# Patient Record
Sex: Male | Born: 1990 | Race: White | Hispanic: No | Smoking: Current every day smoker
Health system: Southern US, Community
[De-identification: ages and names within clinical notes are randomized; demographics above are authoritative.]

## PROBLEM LIST (undated history)

## (undated) HISTORY — PX: FOOT SURGERY: SHX648

---

## 2019-06-20 ENCOUNTER — Other Ambulatory Visit: Payer: Self-pay

## 2019-06-20 ENCOUNTER — Encounter (HOSPITAL_COMMUNITY): Payer: Self-pay

## 2019-06-20 ENCOUNTER — Emergency Department (HOSPITAL_COMMUNITY)
Admission: EM | Admit: 2019-06-20 | Discharge: 2019-06-20 | Disposition: A | Discharge status: Home / Self Care | Payer: Self-pay | Attending: Emergency Medicine | Admitting: Emergency Medicine

## 2019-06-20 DIAGNOSIS — G2581 Restless legs syndrome: Secondary | ICD-10-CM | POA: Insufficient documentation

## 2019-06-20 DIAGNOSIS — G4700 Insomnia, unspecified: Secondary | ICD-10-CM | POA: Insufficient documentation

## 2019-06-20 DIAGNOSIS — F17200 Nicotine dependence, unspecified, uncomplicated: Secondary | ICD-10-CM | POA: Insufficient documentation

## 2019-06-20 MED ORDER — CLONIDINE HCL 0.1 MG PO TABS
0.1000 mg | ORAL_TABLET | Freq: Once | ORAL | Status: AC
  Administered 2019-06-20: 0.1 mg via ORAL
  Filled 2019-06-20: qty 1

## 2019-06-20 MED ORDER — CLONIDINE HCL 0.1 MG PO TABS
0.1000 mg | ORAL_TABLET | Freq: Two times a day (BID) | ORAL | 0 refills | Status: AC
Start: 2019-06-20 — End: 2019-06-25

## 2019-06-20 MED ORDER — HYDROXYZINE HCL 25 MG PO TABS
25.0000 mg | ORAL_TABLET | Freq: Once | ORAL | Status: AC
  Administered 2019-06-20: 25 mg via ORAL
  Filled 2019-06-20: qty 1

## 2019-06-20 MED ORDER — HYDROXYZINE HCL 25 MG PO TABS: 25.0000 mg | ORAL_TABLET | Freq: Three times a day (TID) | ORAL | 0 refills | Status: AC | PRN

## 2019-06-20 NOTE — ED Provider Notes (Signed)
[facility]-EMERGENCY DEPT Provider Note   CSN: [id number] Arrival date & time: 06/20/19  1214     History Chief Complaint  Patient presents with  . Insomnia  . Leg Pain    Matthew Berg is a 29 y.o. male.  The history is provided by the patient.  Illness Location:  General Severity:  Mild Onset quality:  Gradual Timing:  Constant Progression:  Unchanged Chronicity:  New Context:  Patient here due to poor sleep, increased restless legs. 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 Has not been able to sleep. Relieved by:  Nothing Worsened by:  Nothing  Associated symptoms: no abdominal pain, no chest pain, no fever and no shortness of breath        History reviewed. No pertinent past medical history.  There are no problems to display for this patient.   Past Surgical History:  Procedure Laterality Date  . FOOT SURGERY         No family history on file.  Social History   Tobacco Use  . Smoking status: Current Every Day Smoker  . Smokeless tobacco: Never Used  Substance Use Topics  . Alcohol use: Never  . Drug use: Not Currently    Home Medications Prior to Admission medications   Medication Sig Start Date End Date Taking? Authorizing Provider  cloNIDine (CATAPRES) 0.1 MG tablet Take 1 tablet (0.1 mg total) by mouth 2 (two) times daily for 5 days. 06/20/19 06/25/19  Dwayne Begay, DO  hydrOXYzine (ATARAX/VISTARIL) 25 MG tablet Take 1 tablet (25 mg total) by mouth every 8 (eight) hours as needed for up to 20 doses for anxiety. 06/20/19   Virgina Norfolk, DO    Allergies    Patient has no known allergies.  Review of Systems   Review of Systems  Constitutional: Negative for fever.  Respiratory: Negative for shortness of breath.   Cardiovascular: Negative for chest pain.  Gastrointestinal: Negative for abdominal pain.  Neurological: Negative for dizziness, syncope, speech difficulty, weakness and numbness.    Psychiatric/Behavioral: Positive for sleep disturbance. Negative for agitation, behavioral problems, confusion, decreased concentration, dysphoric mood, hallucinations, self-injury and suicidal ideas. The patient is hyperactive.     Physical Exam Updated Vital Signs BP (!) 151/91 (BP Location: Left Arm)   Pulse 85   Temp 98.2 F (36.8 C) (Oral)   Resp 18   SpO2 99%   Physical Exam Vitals and nursing note reviewed.  Constitutional:      Appearance: He is well-developed.  HENT:     Head: Normocephalic and atraumatic.  Eyes:     Conjunctiva/sclera: Conjunctivae normal.  Cardiovascular:     Rate and Rhythm: Normal rate and regular rhythm.     Pulses: Normal pulses.     Heart sounds: No murmur.  Pulmonary:     Effort: Pulmonary effort is normal. No respiratory distress.     Breath sounds: Normal breath sounds.  Abdominal:     Palpations: Abdomen is soft.     Tenderness: There is no abdominal tenderness.  Musculoskeletal:     Cervical back: Neck supple.  Skin:    General: Skin is warm and dry.  Neurological:     Mental Status: He is alert.  Psychiatric:        Mood and Affect: Mood normal.        Thought Content: Thought content normal.        Judgment: Judgment normal.     ED Results / Procedures / Treatments  Labs (all labs ordered are listed, but only abnormal results are displayed) Labs Reviewed - No data to display  EKG None  Radiology No results found.  Procedures Procedures (including critical care time)  Medications Ordered in ED Medications  cloNIDine (CATAPRES) tablet 0.1 mg (has no administration in time range)  hydrOXYzine (ATARAX/VISTARIL) tablet 25 mg (has no administration in time range)    ED Course  I have reviewed the triage vital signs and the nursing notes.  Pertinent labs & imaging results that were available during my care of the patient were reviewed by me and considered in my medical decision making (see chart for details).     MDM Rules/Calculators/A&P                      Matthew Berg is a 29 year old male who presents to the ED with restless legs, poor sleep.  Patient with unremarkable vitals.  Just finished detox from heroin last week and now currently in rehab.  Having difficulty with sleep, restless leg symptoms.  Patient denies any nausea, vomiting, diarrhea, myalgias.  Overall appears to be a little bit anxious.  Will attempt clonidine and Atarax to help with his symptoms.  Otherwise no acute emergency at this time.  Discharged back to rehab facility.  This chart was dictated using voice recognition software.  Despite best efforts to proofread,  errors can occur which can change the documentation meaning.   Final Clinical Impression(s) / ED Diagnoses Final diagnoses:  Insomnia, unspecified type    Rx / DC Orders ED Discharge Orders         Ordered    cloNIDine (CATAPRES) 0.1 MG tablet  2 times daily     06/20/19 1335    hydrOXYzine (ATARAX/VISTARIL) 25 MG tablet  Every 8 hours PRN     06/20/19 1335           Lennice Sites, DO 06/20/19 1336

## 2019-06-20 NOTE — ED Triage Notes (Signed)
Pt presents with c/o bilateral leg pain r/t his restless leg syndrome. 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

## 2019-06-20 NOTE — ED Notes (Addendum)
Pt verbalizes understanding of DC instructions. Pt belongings returned and is ambulatory out of ED.    Signature pad not availible 

## 2019-07-01 ENCOUNTER — Emergency Department (HOSPITAL_COMMUNITY): Payer: Self-pay

## 2019-07-01 ENCOUNTER — Other Ambulatory Visit: Payer: Self-pay

## 2019-07-01 ENCOUNTER — Emergency Department (HOSPITAL_COMMUNITY)
Admission: EM | Admit: 2019-07-01 | Discharge: 2019-07-01 | Disposition: A | Discharge status: Home / Self Care | Payer: Self-pay | Attending: Emergency Medicine | Admitting: Emergency Medicine

## 2019-07-01 ENCOUNTER — Encounter (HOSPITAL_COMMUNITY): Payer: Self-pay | Admitting: Emergency Medicine

## 2019-07-01 DIAGNOSIS — Z79899 Other long term (current) drug therapy: Secondary | ICD-10-CM | POA: Insufficient documentation

## 2019-07-01 DIAGNOSIS — R509 Fever, unspecified: Secondary | ICD-10-CM

## 2019-07-01 DIAGNOSIS — Z20822 Contact with and (suspected) exposure to covid-19: Secondary | ICD-10-CM | POA: Insufficient documentation

## 2019-07-01 DIAGNOSIS — F17200 Nicotine dependence, unspecified, uncomplicated: Secondary | ICD-10-CM | POA: Insufficient documentation

## 2019-07-01 DIAGNOSIS — R251 Tremor, unspecified: Secondary | ICD-10-CM | POA: Insufficient documentation

## 2019-07-01 LAB — CBC WITH DIFFERENTIAL/PLATELET
Abs Immature Granulocytes: 0.04 10*3/uL (ref 0.00–0.07)
Basophils Absolute: 0 10*3/uL (ref 0.0–0.1)
Basophils Relative: 1 %
Eosinophils Absolute: 0.1 10*3/uL (ref 0.0–0.5)
Eosinophils Relative: 1 %
HCT: 40.4 % (ref 39.0–52.0)
Hemoglobin: 13.4 g/dL (ref 13.0–17.0)
Immature Granulocytes: 1 %
Lymphocytes Relative: 20 %
Lymphs Abs: 1.3 10*3/uL (ref 0.7–4.0)
MCH: 28.6 pg (ref 26.0–34.0)
MCHC: 33.2 g/dL (ref 30.0–36.0)
MCV: 86.3 fL (ref 80.0–100.0)
Monocytes Absolute: 0.3 10*3/uL (ref 0.1–1.0)
Monocytes Relative: 5 %
Neutro Abs: 4.9 10*3/uL (ref 1.7–7.7)
Neutrophils Relative %: 72 %
Platelets: 235 10*3/uL (ref 150–400)
RBC: 4.68 MIL/uL (ref 4.22–5.81)
RDW: 13.1 % (ref 11.5–15.5)
WBC: 6.7 10*3/uL (ref 4.0–10.5)
nRBC: 0 % (ref 0.0–0.2)

## 2019-07-01 LAB — COMPREHENSIVE METABOLIC PANEL
ALT: 69 U/L — ABNORMAL HIGH (ref 0–44)
AST: 41 U/L (ref 15–41)
Albumin: 4.2 g/dL (ref 3.5–5.0)
Alkaline Phosphatase: 71 U/L (ref 38–126)
Anion gap: 12 (ref 5–15)
BUN: 14 mg/dL (ref 6–20)
CO2: 21 mmol/L — ABNORMAL LOW (ref 22–32)
Calcium: 8.9 mg/dL (ref 8.9–10.3)
Chloride: 104 mmol/L (ref 98–111)
Creatinine, Ser: 0.8 mg/dL (ref 0.61–1.24)
GFR calc Af Amer: >60 mL/min (ref >60)
GFR calc non Af Amer: >60 mL/min (ref >60)
Glucose, Bld: 97 mg/dL (ref 70–99)
Potassium: 4.1 mmol/L (ref 3.5–5.1)
Sodium: 137 mmol/L (ref 135–145)
Total Bilirubin: 0.4 mg/dL (ref 0.3–1.2)
Total Protein: 7.6 g/dL (ref 6.5–8.1)

## 2019-07-01 LAB — RAPID URINE DRUG SCREEN, HOSP PERFORMED
Amphetamines: NOT DETECTED
Barbiturates: NOT DETECTED
Benzodiazepines: NOT DETECTED
Cocaine: NOT DETECTED
Opiates: NOT DETECTED
Tetrahydrocannabinol: NOT DETECTED

## 2019-07-01 LAB — URINALYSIS, ROUTINE W REFLEX MICROSCOPIC
Bilirubin Urine: NEGATIVE
Glucose, UA: NEGATIVE mg/dL
Hgb urine dipstick: NEGATIVE
Ketones, ur: NEGATIVE mg/dL
Leukocytes,Ua: NEGATIVE
Nitrite: NEGATIVE
Protein, ur: NEGATIVE mg/dL
Specific Gravity, Urine: 1.015 (ref 1.005–1.030)
pH: 5 (ref 5.0–8.0)

## 2019-07-01 LAB — ETHANOL: Alcohol, Ethyl (B): <10 mg/dL (ref <10)

## 2019-07-01 LAB — LACTIC ACID, PLASMA: Lactic Acid, Venous: 0.9 mmol/L (ref 0.5–1.9)

## 2019-07-01 LAB — SARS CORONAVIRUS 2 BY RT PCR (HOSPITAL ORDER, PERFORMED IN ~~LOC~~ HOSPITAL LAB): SARS Coronavirus 2: NEGATIVE

## 2019-07-01 MED ORDER — SODIUM CHLORIDE 0.9 % IV BOLUS
1000.0000 mL | Freq: Once | INTRAVENOUS | Status: AC
  Administered 2019-07-01: 1000 mL via INTRAVENOUS

## 2019-07-01 MED ORDER — ACETAMINOPHEN 325 MG PO TABS
650.0000 mg | ORAL_TABLET | Freq: Once | ORAL | Status: AC
  Administered 2019-07-01: 650 mg via ORAL
  Filled 2019-07-01: qty 2

## 2019-07-01 NOTE — ED Provider Notes (Signed)
[facility]-EMERGENCY DEPT Provider Note   CSN: [id number] Arrival date & time: 07/01/19  1738     History Chief Complaint  Patient presents with  . Shaking    Matthew Berg is a 29 y.o. male.  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 He has not had any cough, congestion, N/V/D or dysuria. HE is unable to give any additional details about the duration, timing or quality of his symptoms. Patient reports he was outside during the day today, but was under a shade tree.     History reviewed. No pertinent past medical history.  There are no problems to display for this patient.   Past Surgical History:  Procedure Laterality Date  . FOOT SURGERY         No family history on file.  Social History   Tobacco Use  . Smoking status: Current Every Day Smoker  . Smokeless tobacco: Never Used  Substance Use Topics  . Alcohol use: Never  . Drug use: Not Currently    Home Medications Prior to Admission medications   Medication Sig Start Date End Date Taking? Authorizing Provider  Aspirin-Salicylamide-Caffeine (BC HEADACHE PO) Take 1 packet by mouth daily as needed (headache).   Yes [provider]  hydrOXYzine (ATARAX/VISTARIL) 25 MG tablet Take 1 tablet (25 mg total) by mouth every 8 (eight) hours as needed for up to 20 doses for anxiety. 06/20/19  Yes Curatolo, Adam, DO  cloNIDine (CATAPRES) 0.1 MG tablet Take 1 tablet (0.1 mg total) by mouth 2 (two) times daily for 5 days. 06/20/19 06/25/19  Virgina Norfolk, DO    Allergies    Patient has no known allergies.  Review of Systems   Review of Systems A comprehensive review of systems was completed and negative except as noted in HPI.   Physical Exam Updated Vital Signs BP (!) 142/79   Pulse 81    Temp 100 F (37.8 C) (Oral)   Resp (!) 24   SpO2 100%   Physical Exam Vitals and nursing note reviewed.  Constitutional:      Appearance: Normal appearance.  HENT:     Head: Normocephalic and atraumatic.     Nose: Nose normal.     Mouth/Throat:     Mouth: Mucous membranes are moist.  Eyes:     Extraocular Movements: Extraocular movements intact.     Conjunctiva/sclera: Conjunctivae normal.  Cardiovascular:     Rate and Rhythm: Normal rate.  Pulmonary:     Effort: Pulmonary effort is normal.     Breath sounds: Normal breath sounds.  Abdominal:     General: Abdomen is flat.     Palpations: Abdomen is soft.     Tenderness: There is no abdominal tenderness.  Musculoskeletal:        General: No swelling. Normal range of motion.     Cervical back: Normal range of motion and neck supple. No rigidity.  Skin:    General: Skin is warm and dry.  Neurological:     General: No focal deficit present.     Comments: Somnolent but arousable; slow to answer questions  Psychiatric:        Mood and Affect: Mood normal.     ED Results / Procedures / Treatments   Labs (all labs  ordered are listed, but only abnormal results are displayed) Labs Reviewed  COMPREHENSIVE METABOLIC PANEL - Abnormal; Notable for the following components:      Result Value   CO2 21 (*)    ALT 69 (*)    All other components within normal limits  SARS CORONAVIRUS 2 BY RT PCR (HOSPITAL ORDER, [facility])  CULTURE, BLOOD (ROUTINE X 2)  CULTURE, BLOOD (ROUTINE X 2)  ETHANOL  CBC WITH DIFFERENTIAL/PLATELET  URINALYSIS, ROUTINE W REFLEX MICROSCOPIC  RAPID URINE DRUG SCREEN, HOSP PERFORMED  LACTIC ACID, PLASMA  LACTIC ACID, PLASMA    EKG None  Radiology DG Chest 1 View  Result Date: 07/01/2019 CLINICAL DATA:  Fever, shakes and body aches EXAM: CHEST  1 VIEW COMPARISON:  None. FINDINGS: No consolidation, features of edema, pneumothorax, or effusion. Pulmonary vascularity is  normally distributed. The cardiomediastinal contours are unremarkable. No acute osseous or soft tissue abnormality. Telemetry leads overlie the chest. IMPRESSION: No acute cardiopulmonary abnormality. Electronically Signed   By: Lovena Le M.D.   On: 07/01/2019 19:09    Procedures Procedures (including critical care time)  Medications Ordered in ED Medications  sodium chloride 0.9 % bolus 1,000 mL (1,000 mLs Intravenous New Bag/Given 07/01/19 1845)  acetaminophen (TYLENOL) tablet 650 mg (650 mg Oral Given 07/01/19 1834)    ED Course  I have reviewed the triage vital signs and the nursing notes.  Pertinent labs & imaging results that were available during my care of the patient were reviewed by me and considered in my medical decision making (see chart for details).  Clinical Course as of Jun 30 2236  Nancy Fetter Jul 01, 2019  1914 CXR is negative.    [CS]  1920 WBC is normal   [CS]  2235 Patient feeling better after IVF. No additional fevers or shaking spells in the ED. He has been ambulatory and would like to go home. Advised to drink plenty of fluid, stay out of the heat and return to the ED if symptoms return.    [CS]    Clinical Course User Index [CS] Truddie Hidden, MD   MDM Rules/Calculators/A&P                      Patient with low grade fever, body aches and chills. Has history of IVDA although reports no use in the last 3 weeks. No skin rashes to suggest endocarditis but he is high risk. Will check labs, CXR and UA to look for source of fever. Check Covid. Give IVF.  Final Clinical Impression(s) / ED Diagnoses Final diagnoses:  Fever, unknown origin    Rx / DC Orders ED Discharge Orders    None       Truddie Hidden, MD 07/01/19 2238

## 2019-07-01 NOTE — ED Triage Notes (Signed)
Pt reports having shakes that started this morning and having body aches.

## 2019-07-02 ENCOUNTER — Emergency Department (HOSPITAL_COMMUNITY): Payer: Self-pay

## 2019-07-02 ENCOUNTER — Encounter (HOSPITAL_COMMUNITY): Payer: Self-pay | Admitting: Emergency Medicine

## 2019-07-02 ENCOUNTER — Other Ambulatory Visit: Payer: Self-pay

## 2019-07-02 ENCOUNTER — Emergency Department (HOSPITAL_COMMUNITY)
Admission: EM | Admit: 2019-07-02 | Discharge: 2019-07-02 | Disposition: A | Discharge status: Home / Self Care | Payer: Self-pay | Attending: Emergency Medicine | Admitting: Emergency Medicine

## 2019-07-02 DIAGNOSIS — M791 Myalgia, unspecified site: Secondary | ICD-10-CM

## 2019-07-02 DIAGNOSIS — M7918 Myalgia, other site: Secondary | ICD-10-CM | POA: Insufficient documentation

## 2019-07-02 DIAGNOSIS — R509 Fever, unspecified: Secondary | ICD-10-CM | POA: Insufficient documentation

## 2019-07-02 DIAGNOSIS — R55 Syncope and collapse: Secondary | ICD-10-CM | POA: Insufficient documentation

## 2019-07-02 DIAGNOSIS — I10 Essential (primary) hypertension: Secondary | ICD-10-CM | POA: Insufficient documentation

## 2019-07-02 DIAGNOSIS — F17210 Nicotine dependence, cigarettes, uncomplicated: Secondary | ICD-10-CM | POA: Insufficient documentation

## 2019-07-02 LAB — CBC WITH DIFFERENTIAL/PLATELET
Abs Immature Granulocytes: 0.03 10*3/uL (ref 0.00–0.07)
Basophils Absolute: 0 10*3/uL (ref 0.0–0.1)
Basophils Relative: 0 %
Eosinophils Absolute: 0 10*3/uL (ref 0.0–0.5)
Eosinophils Relative: 1 %
HCT: 40.4 % (ref 39.0–52.0)
Hemoglobin: 13.1 g/dL (ref 13.0–17.0)
Immature Granulocytes: 1 %
Lymphocytes Relative: 17 %
Lymphs Abs: 0.9 10*3/uL (ref 0.7–4.0)
MCH: 28.6 pg (ref 26.0–34.0)
MCHC: 32.4 g/dL (ref 30.0–36.0)
MCV: 88.2 fL (ref 80.0–100.0)
Monocytes Absolute: 0.3 10*3/uL (ref 0.1–1.0)
Monocytes Relative: 5 %
Neutro Abs: 4.2 10*3/uL (ref 1.7–7.7)
Neutrophils Relative %: 76 %
Platelets: 193 10*3/uL (ref 150–400)
RBC: 4.58 MIL/uL (ref 4.22–5.81)
RDW: 13.2 % (ref 11.5–15.5)
WBC: 5.5 10*3/uL (ref 4.0–10.5)
nRBC: 0 % (ref 0.0–0.2)

## 2019-07-02 LAB — URINALYSIS, ROUTINE W REFLEX MICROSCOPIC
Bacteria, UA: NONE SEEN
Bilirubin Urine: NEGATIVE
Glucose, UA: NEGATIVE mg/dL
Hgb urine dipstick: NEGATIVE
Ketones, ur: NEGATIVE mg/dL
Leukocytes,Ua: NEGATIVE
Nitrite: NEGATIVE
Protein, ur: 30 mg/dL — AB
Specific Gravity, Urine: 1.024 (ref 1.005–1.030)
pH: 6 (ref 5.0–8.0)

## 2019-07-02 LAB — COMPREHENSIVE METABOLIC PANEL
ALT: 98 U/L — ABNORMAL HIGH (ref 0–44)
AST: 75 U/L — ABNORMAL HIGH (ref 15–41)
Albumin: 3.9 g/dL (ref 3.5–5.0)
Alkaline Phosphatase: 75 U/L (ref 38–126)
Anion gap: 7 (ref 5–15)
BUN: 15 mg/dL (ref 6–20)
CO2: 24 mmol/L (ref 22–32)
Calcium: 8.3 mg/dL — ABNORMAL LOW (ref 8.9–10.3)
Chloride: 105 mmol/L (ref 98–111)
Creatinine, Ser: 0.88 mg/dL (ref 0.61–1.24)
GFR calc Af Amer: >60 mL/min (ref >60)
GFR calc non Af Amer: >60 mL/min (ref >60)
Glucose, Bld: 116 mg/dL — ABNORMAL HIGH (ref 70–99)
Potassium: 4 mmol/L (ref 3.5–5.1)
Sodium: 136 mmol/L (ref 135–145)
Total Bilirubin: 0.3 mg/dL (ref 0.3–1.2)
Total Protein: 7.1 g/dL (ref 6.5–8.1)

## 2019-07-02 LAB — CK: Total CK: 76 U/L (ref 49–397)

## 2019-07-02 MED ORDER — DOXYCYCLINE HYCLATE 100 MG PO TABS: 100.0000 mg | ORAL_TABLET | Freq: Two times a day (BID) | ORAL | 0 refills | Status: AC

## 2019-07-02 MED ORDER — NAPROXEN 500 MG PO TABS: 500.0000 mg | ORAL_TABLET | Freq: Two times a day (BID) | ORAL | 0 refills | Status: AC

## 2019-07-02 MED ORDER — SODIUM CHLORIDE 0.9 % IV SOLN
1000.0000 mL | INTRAVENOUS | Status: DC
  Administered 2019-07-02: 1000 mL via INTRAVENOUS

## 2019-07-02 MED ORDER — SODIUM CHLORIDE 0.9 % IV BOLUS (SEPSIS)
1000.0000 mL | Freq: Once | INTRAVENOUS | Status: AC
  Administered 2019-07-02: 1000 mL via INTRAVENOUS

## 2019-07-02 MED ORDER — KETOROLAC TROMETHAMINE 30 MG/ML IJ SOLN
30.0000 mg | Freq: Once | INTRAMUSCULAR | Status: AC
  Administered 2019-07-02: 30 mg via INTRAVENOUS
  Filled 2019-07-02: qty 1

## 2019-07-02 NOTE — ED Triage Notes (Signed)
Patient reports body aches and dizziness since yesterday. States he had witnessed syncopal episode on way to hospital today. Denies hitting head. States he was seen here yesterday and dx with heat exhaustion.

## 2019-07-02 NOTE — Discharge Instructions (Addendum)
Take the medication as prescribed.   Follow up with a primary care doctor for further evaluation.

## 2019-07-02 NOTE — ED Provider Notes (Signed)
[facility]-EMERGENCY DEPT Provider Note   CSN: [id number] Arrival date & time: 07/02/19  1252     History Chief Complaint  Patient presents with  . Loss of Consciousness    Matthew Berg is a 29 y.o. male.  HPI    Patient presents the ED for evaluation of body aches and myalgias.  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  Patient states a few days ago he started having issues with generalized body aches.  He hurts all over.  He is having some shaking spells.  He has had some nausea.  Patient denies any cough or congestion.  No diarrhea.  Patient denies any tick bites or insect bites.  He denies having Covid exposure but has not been vaccinated.  Patient was seen in the emergency room yesterday for the same thing.  He was evaluated and released.  Patient states he continues to have the symptoms.  He states he had a fainting spell prior to arrival.  No past medical history on file.  There are no problems to display for this patient.   Past Surgical History:  Procedure Laterality Date  . FOOT SURGERY         No family history on file.  Social History   Tobacco Use  . Smoking status: Current Every Day Smoker    Packs/day: 1.00    Types: Cigarettes  . Smokeless tobacco: Never Used  Substance Use Topics  . Alcohol use: Never  . Drug use: Yes    Types: IV, Methamphetamines    Comment: States he uses meth and fentanyl     Home Medications Prior to Admission medications   Medication Sig Start Date End Date Taking? Authorizing Provider  Aspirin-Salicylamide-Caffeine (BC HEADACHE PO) Take 1 packet by mouth daily as needed (headache).    [provider]  cloNIDine (CATAPRES) 0.1 MG tablet Take 1 tablet (0.1 mg total) by mouth 2 (two) times daily for 5 days. 06/20/19 06/25/19  Curatolo, Adam, DO  doxycycline (VIBRA-TABS) 100 MG tablet Take 1 tablet (100 mg  total) by mouth 2 (two) times daily. 07/02/19   Linwood Dibbles, MD  hydrOXYzine (ATARAX/VISTARIL) 25 MG tablet Take 1 tablet (25 mg total) by mouth every 8 (eight) hours as needed for up to 20 doses for anxiety. 06/20/19   Curatolo, Adam, DO  naproxen (NAPROSYN) 500 MG tablet Take 1 tablet (500 mg total) by mouth 2 (two) times daily with a meal. As needed for pain 07/02/19   Linwood Dibbles, MD    Allergies    Patient has no known allergies.  Review of Systems   Review of Systems  All other systems reviewed and are negative.   Physical Exam Updated Vital Signs BP 138/67   Pulse 84   Temp 99.7 F (37.6 C) (Oral)   Resp 18   Ht 1.803 m (5\' 11" )   Wt 77.1 kg   SpO2 97%   BMI 23.71 kg/m   Physical Exam Vitals and nursing note reviewed.  Constitutional:      Appearance: He is well-developed. He is not toxic-appearing.  HENT:     Head: Normocephalic and atraumatic.     Right Ear: External ear normal.     Left Ear: External ear normal.     Nose: No congestion or rhinorrhea.     Mouth/Throat:     Pharynx: No oropharyngeal exudate or  posterior oropharyngeal erythema.  Eyes:     General: No scleral icterus.       Right eye: No discharge.        Left eye: No discharge.     Conjunctiva/sclera: Conjunctivae normal.  Neck:     Trachea: No tracheal deviation.  Cardiovascular:     Rate and Rhythm: Normal rate and regular rhythm.  Pulmonary:     Effort: Pulmonary effort is normal. No respiratory distress.     Breath sounds: Normal breath sounds. No stridor. No wheezing or rales.  Abdominal:     General: Bowel sounds are normal. There is no distension.     Palpations: Abdomen is soft.     Tenderness: There is no abdominal tenderness. There is no guarding or rebound.  Musculoskeletal:        General: Tenderness present.     Cervical back: Neck supple. No rigidity.     Comments: Mild tenderness to patient in the arms and legs, no areas of swelling, no evidence of joint effusion    Lymphadenopathy:     Cervical: No cervical adenopathy.  Skin:    General: Skin is warm and dry.     Findings: No rash.  Neurological:     Mental Status: He is alert.     Cranial Nerves: No cranial nerve deficit (no facial droop, extraocular movements intact, no slurred speech).     Sensory: No sensory deficit.     Motor: No abnormal muscle tone or seizure activity.     Coordination: Coordination normal.     ED Results / Procedures / Treatments   Labs (all labs ordered are listed, but only abnormal results are displayed) Labs Reviewed  COMPREHENSIVE METABOLIC PANEL - Abnormal; Notable for the following components:      Result Value   Glucose, Bld 116 (*)    Calcium 8.3 (*)    AST 75 (*)    ALT 98 (*)    All other components within normal limits  URINALYSIS, ROUTINE W REFLEX MICROSCOPIC - Abnormal; Notable for the following components:   Protein, ur 30 (*)    All other components within normal limits  CULTURE, BLOOD (ROUTINE X 2)  CULTURE, BLOOD (ROUTINE X 2)  CBC WITH DIFFERENTIAL/PLATELET  CK    EKG EKG Interpretation  Date/Time:  Monday July 02 2019 13:58:32 EDT Ventricular Rate:  91 PR Interval:    QRS Duration: 78 QT Interval:  346 QTC Calculation: 426 R Axis:   60 Text Interpretation: Sinus rhythm No old tracing to compare Confirmed by Linwood Dibbles [phone]) on 07/02/2019 2:22:14 PM   Radiology DG Chest 1 View  Result Date: 07/01/2019 CLINICAL DATA:  Fever, shakes and body aches EXAM: CHEST  1 VIEW COMPARISON:  None. FINDINGS: No consolidation, features of edema, pneumothorax, or effusion. Pulmonary vascularity is normally distributed. The cardiomediastinal contours are unremarkable. No acute osseous or soft tissue abnormality. Telemetry leads overlie the chest. IMPRESSION: No acute cardiopulmonary abnormality. Electronically Signed   By: Kreg Shropshire M.D.   On: 07/01/2019 19:09   DG Chest 2 View  Result Date: 07/02/2019 CLINICAL DATA:  Myalgias and dizziness.   Syncope. EXAM: CHEST - 2 VIEW COMPARISON:  July 01, 2019 FINDINGS: Lungs are clear. The heart size and pulmonary vascularity are normal. No adenopathy. No pneumothorax. No bone lesions. IMPRESSION: Lungs clear.  Cardiac silhouette within normal limits. Electronically Signed   By: Bretta Bang III M.D.   On: 07/02/2019 14:46    Procedures Procedures (including  critical care time)  Medications Ordered in ED Medications  sodium chloride 0.9 % bolus 1,000 mL (0 mLs Intravenous Stopped 07/02/19 1431)    Followed by  0.9 %  sodium chloride infusion (1,000 mLs Intravenous New Bag/Given 07/02/19 1432)  ketorolac (TORADOL) 30 MG/ML injection 30 mg (30 mg Intravenous Given 07/02/19 1404)    ED Course  I have reviewed the triage vital signs and the nursing notes.  Pertinent labs & imaging results that were available during my care of the patient were reviewed by me and considered in my medical decision making (see chart for details).  Clinical Course as of Jul 01 1545  Mon Jul 02, 2019  1516 Laboratory tests are reassuring.  No evidence of rhabdomyolysis.  No urinary tract infection.  No leukocytosis.  Slight increase in LFTs but otherwise no significant metabolic abnormality.   [OQ]  9476 Chest x-ray without signs of pneumonia.   [JK]    Clinical Course User Index [JK] Dorie Rank, MD   MDM Rules/Calculators/A&P                      Pt presented with myalgias, mild low grade temp. No signs of meningitis.  Doubt endocarditis, no murmus, no tachcyardia, or elevated wbc.  No pna.  NO uti.  Doubt withdrawal. ? Tick borne illness although no definite exposure and no rash.  Will cover with doxy.  Dc home with nsaids. Final Clinical Impression(s) / ED Diagnoses Final diagnoses:  Myalgia    Rx / DC Orders ED Discharge Orders         Ordered    doxycycline (VIBRA-TABS) 100 MG tablet  2 times daily     07/02/19 1547    naproxen (NAPROSYN) 500 MG tablet  2 times daily with meals     07/02/19 1547            Dorie Rank, MD 07/02/19 1553

## 2019-07-06 LAB — CULTURE, BLOOD (ROUTINE X 2)
Culture: NO GROWTH
Culture: NO GROWTH
Special Requests: ADEQUATE

## 2019-07-07 LAB — CULTURE, BLOOD (ROUTINE X 2)
Culture: NO GROWTH
Culture: NO GROWTH

## 2019-07-23 ENCOUNTER — Encounter: Payer: Self-pay | Admitting: *Deleted

## 2019-07-23 NOTE — Congregational Nurse Program (Signed)
  Dept: [phone]   Congregational Nurse Program Note  Date of Encounter: 07/23/2019  Past Medical History: No past medical history on file.  Encounter Details:  CNP Questionnaire - 07/23/19 1132      Questionnaire   Race White or Caucasian    Location Patient Served At [employer] Not Applicable    Uninsured Uninsured (NEW 1x/quarter)    Food No food insecurities    Housing/Utilities Yes, have permanent housing    Transportation No transportation needs    Interpersonal Safety Yes, feel physically and emotionally safe where you currently live    Medication No medication insecurities;Yes, have medication insecurities    Medical Provider No    Referrals Primary Care Provider/Clinic    ED Visit Averted Not Applicable    Life-Saving Intervention Made Not Applicable          Client came to [facility] requesting help with medication. He is from [facility] and staying at [facility] [place]. Client takes citalopram 20mg  daily. He is requesting second dose of Hep A vaccine. Referred to NP. Jud Fanguy W RN CN [phone]

## 2020-10-06 IMAGING — DX DG CHEST 1V
1 series · 1 of 1 positions shown · non-contrast
Comparison: None.

CLINICAL DATA: Fever, shakes and body aches

EXAM:
CHEST  1 VIEW

[chest ap]
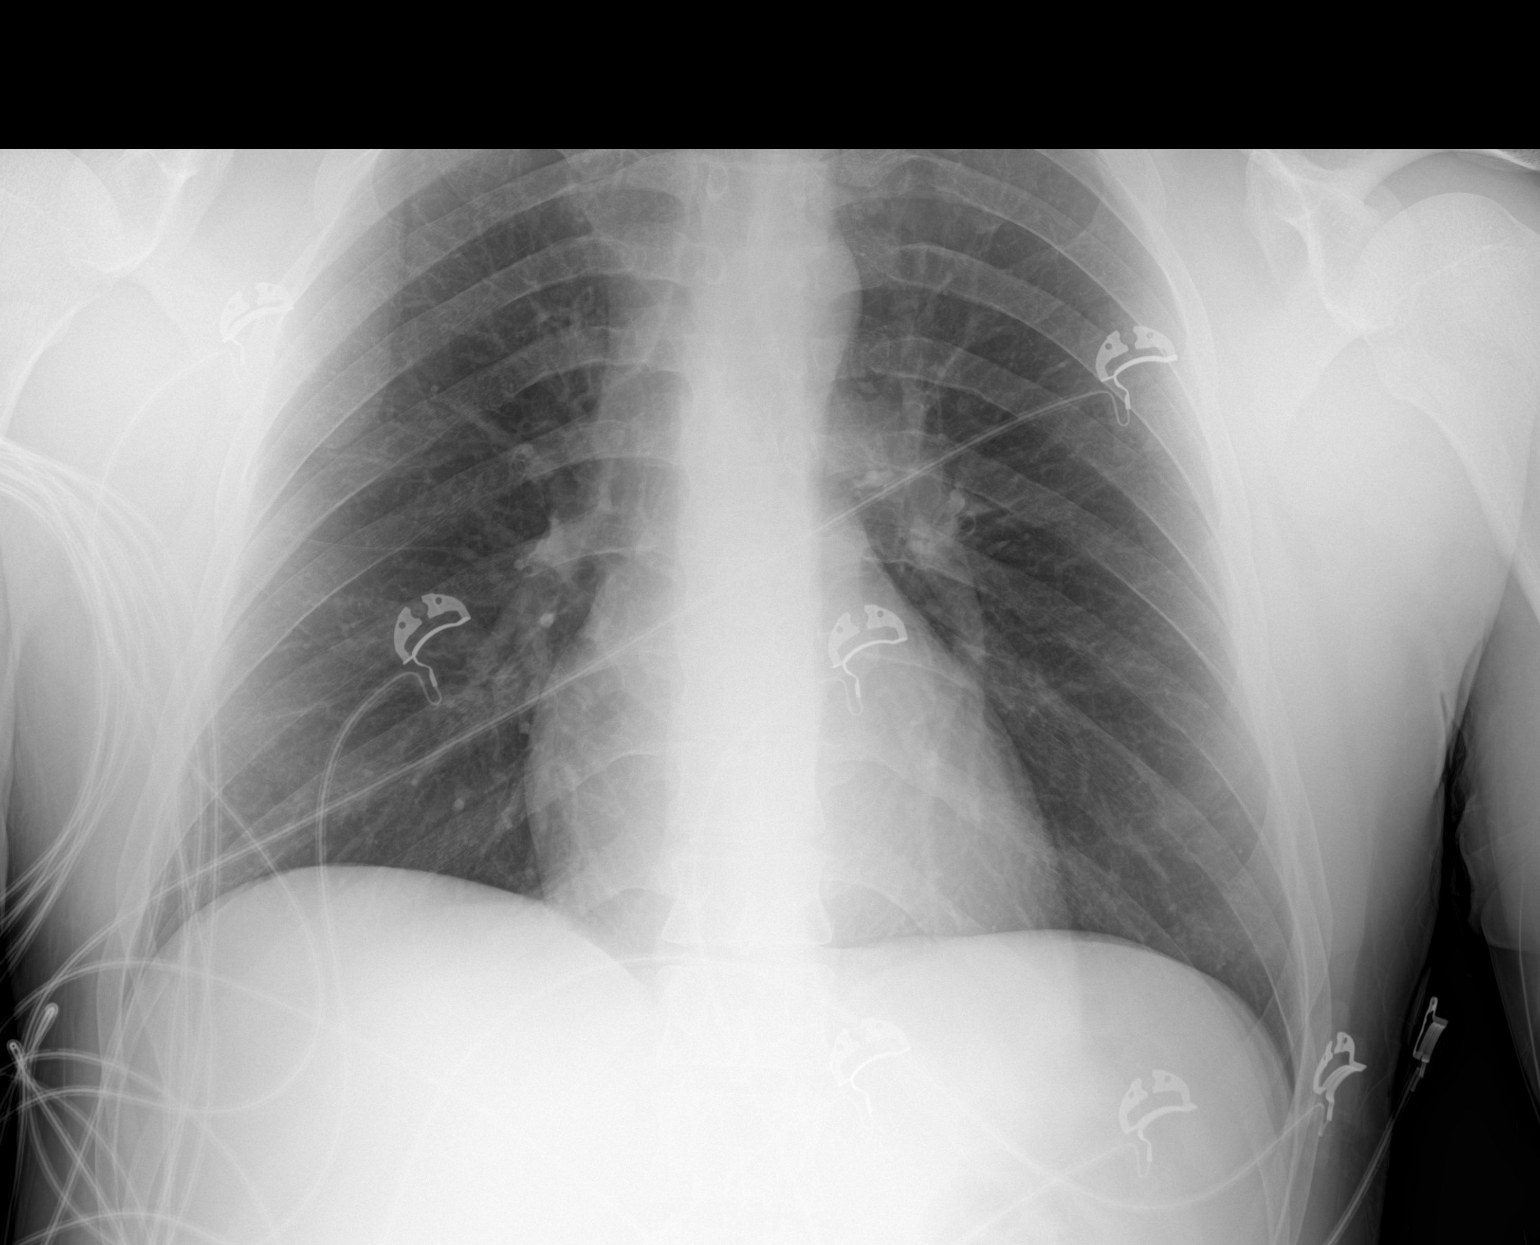

[1 of 1 positions shown; findings below may reference images not displayed]

FINDINGS: No consolidation, features of edema, pneumothorax, or effusion.
Pulmonary vascularity is normally distributed. The cardiomediastinal
contours are unremarkable. No acute osseous or soft tissue
abnormality. Telemetry leads overlie the chest.
IMPRESSION: No acute cardiopulmonary abnormality.
